# Patient Record
Sex: Male | Born: 1995 | Race: Black or African American | Hispanic: No | Marital: Single | Smoking: Never smoker
Health system: Southern US, Community
[De-identification: ages and names within clinical notes are randomized; demographics above are authoritative.]

## PROBLEM LIST (undated history)

## (undated) ENCOUNTER — Ambulatory Visit (HOSPITAL_COMMUNITY): Disposition: A | Discharge status: Home / Self Care | Payer: BLUE CROSS/BLUE SHIELD

---
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
## 2013-01-11 ENCOUNTER — Other Ambulatory Visit (HOSPITAL_COMMUNITY)
Admission: RE | Admit: 2013-01-11 | Discharge: 2013-01-11 | Disposition: A | Discharge status: Home / Self Care | Payer: Medicaid Other | Source: Ambulatory Visit | Attending: Family Medicine | Admitting: Family Medicine

## 2013-01-11 ENCOUNTER — Encounter (HOSPITAL_COMMUNITY): Payer: Self-pay

## 2013-01-11 ENCOUNTER — Emergency Department (INDEPENDENT_AMBULATORY_CARE_PROVIDER_SITE_OTHER)
Admission: EM | Admit: 2013-01-11 | Discharge: 2013-01-11 | Disposition: A | Discharge status: Home / Self Care | Payer: Medicaid Other | Source: Home / Self Care | Attending: Family Medicine | Admitting: Family Medicine

## 2013-01-11 DIAGNOSIS — Z113 Encounter for screening for infections with a predominantly sexual mode of transmission: Secondary | ICD-10-CM | POA: Insufficient documentation

## 2013-01-11 DIAGNOSIS — Z9189 Other specified personal risk factors, not elsewhere classified: Secondary | ICD-10-CM

## 2013-01-11 DIAGNOSIS — Z202 Contact with and (suspected) exposure to infections with a predominantly sexual mode of transmission: Secondary | ICD-10-CM

## 2013-01-11 NOTE — ED Notes (Signed)
Call back number for lab reports verified at release

## 2013-01-11 NOTE — ED Provider Notes (Signed)
  CSN: [id number]     Arrival date & time 01/11/13  1301 History     First MD Initiated Contact with Patient 01/11/13 1432     Chief Complaint  Patient presents with  . Exposure to STD   (Consider location/radiation/quality/duration/timing/severity/associated sxs/prior Treatment) HPI Comments: Male partner has "bumps" she thought was herpes, but she was tested for herpes and doesn't have it. Pt wants STD check today. Denies having any sx.   Patient is a 17 y.o. male presenting with STD exposure. The history is provided by the patient.  Exposure to STD This is a new problem. The problem has not changed since onset.Pertinent negatives include no abdominal pain. Nothing aggravates the symptoms. Nothing relieves the symptoms. He has tried nothing for the symptoms.    History reviewed. No pertinent past medical history. History reviewed. No pertinent past surgical history. History reviewed. No pertinent family history. History  Substance Use Topics  . Smoking status: Never Smoker   . Smokeless tobacco: Not on file  . Alcohol Use: No    Review of Systems  Constitutional: Negative for fever and chills.  Gastrointestinal: Negative for abdominal pain.  Genitourinary: Negative for dysuria, flank pain, discharge, genital sores, penile pain and testicular pain.  Skin: Negative for rash.    Allergies  Review of patient's allergies indicates no known allergies.  Home Medications   Current Outpatient Rx  Name  Route  Sig  Dispense  Refill  . cyclobenzaprine (FLEXERIL) 10 MG tablet   Oral   Take 1 tablet (10 mg total) by mouth 3 (three) times daily as needed for muscle spasms.   30 tablet   0   . meloxicam (MOBIC) 15 MG tablet   Oral   Take 1 tablet (15 mg total) by mouth daily as needed for pain.   30 tablet   0    BP 125/65  Pulse 66  Temp(Src) 98 F (36.7 C) (Oral)  Resp 16  SpO2 100% Physical Exam  Constitutional: He appears well-developed and well-nourished. No  distress.  Abdominal: Hernia confirmed negative in the right inguinal area and confirmed negative in the left inguinal area.  Genitourinary: Testes normal and penis normal. Circumcised. No penile erythema or penile tenderness. No discharge found.  Lymphadenopathy:       Right: No inguinal adenopathy present.       Left: No inguinal adenopathy present.    ED Course   Procedures (including critical care time)  Labs Reviewed  RPR  HIV ANTIBODY (ROUTINE TESTING)  URINE CYTOLOGY ANCILLARY ONLY   No results found. 1. Possible exposure to STD     MDM  Tested for HIV, syphilis, gc, chlamydia, and trich. Pt defers tx until test results back as is asymptomatic and does not have certain exposure.   Cathlyn Parsons, NP 01/11/13 1436

## 2013-01-11 NOTE — ED Notes (Signed)
Reported exposure to male that has a "rash ", thought it was herpes, but not testing positive ; patient denies any symptoms, rash, pain, burning, d/c

## 2013-01-12 LAB — HIV ANTIBODY (ROUTINE TESTING W REFLEX): HIV: NONREACTIVE

## 2013-01-12 LAB — RPR: RPR Ser Ql: NONREACTIVE

## 2013-01-12 NOTE — ED Provider Notes (Signed)
Medical screening examination/treatment/procedure(s) were performed by non-physician practitioner and as supervising physician I was immediately available for consultation/collaboration.   MORENO-COLL,Basya Casavant; MD  Landon Bassford Moreno-Coll, MD 01/12/13 0820 

## 2013-11-28 IMAGING — CR DG HAND COMPLETE 3+V*R*
3 series · 3 of 3 positions shown · non-contrast
Comparison: None.

CLINICAL DATA: Jammed playing basketball

RIGHT HAND - COMPLETE 3+ VIEW

[x hand pa right]
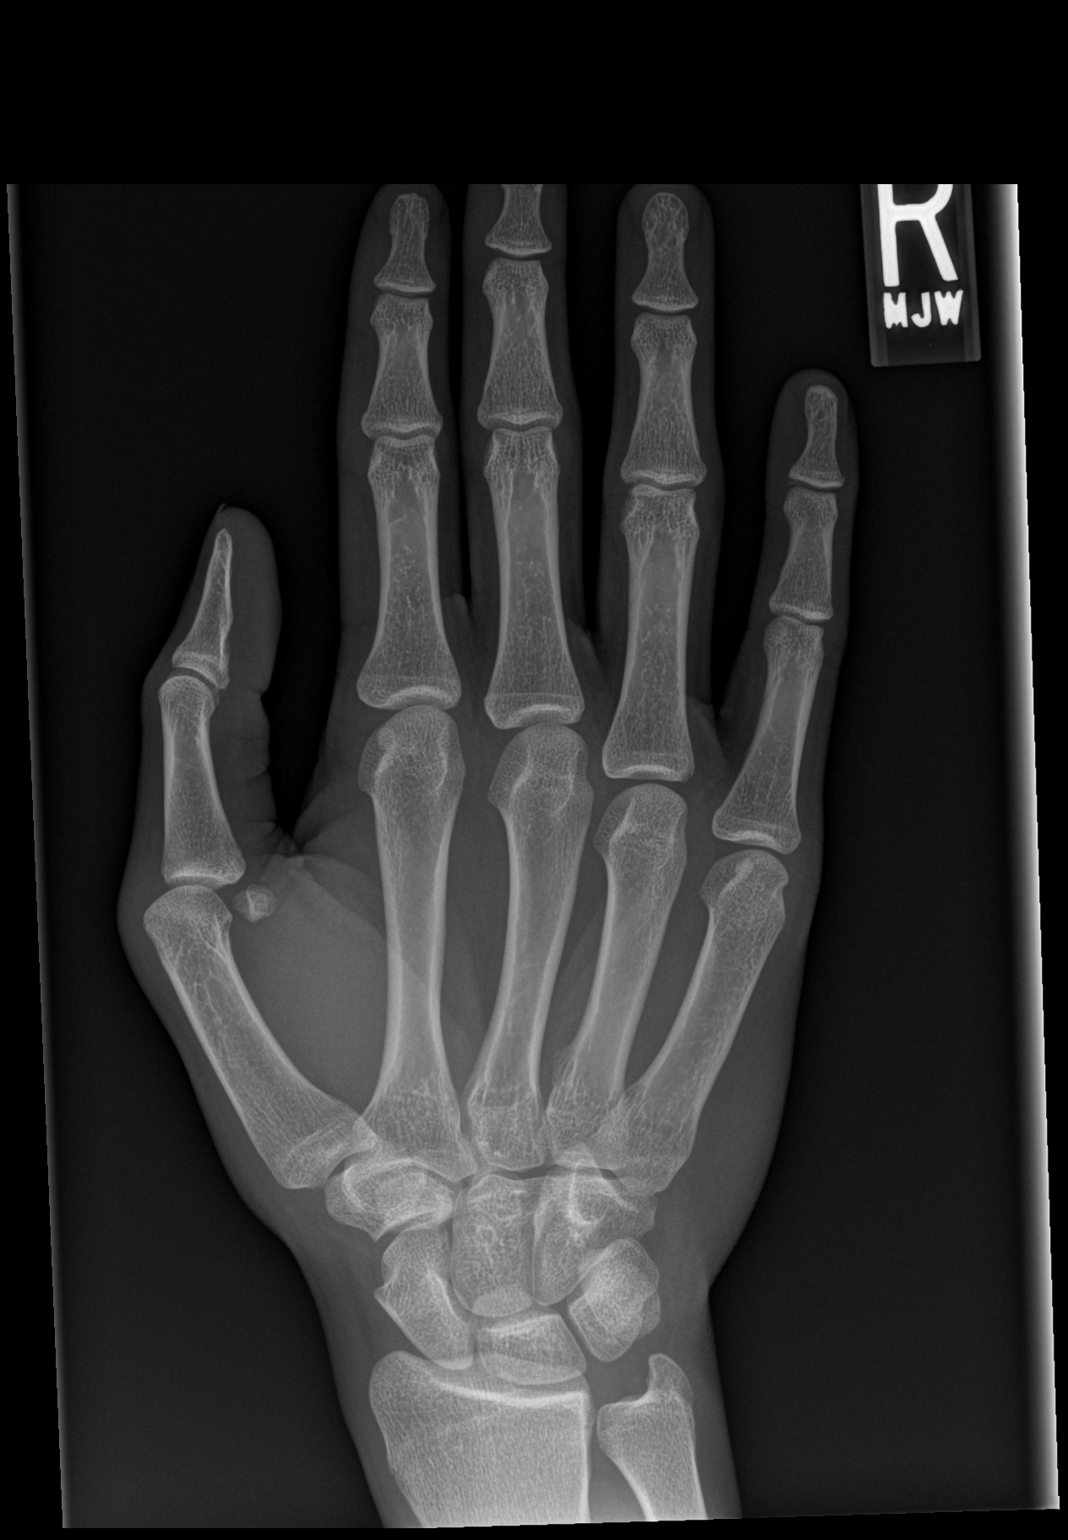

[x hand obl right]
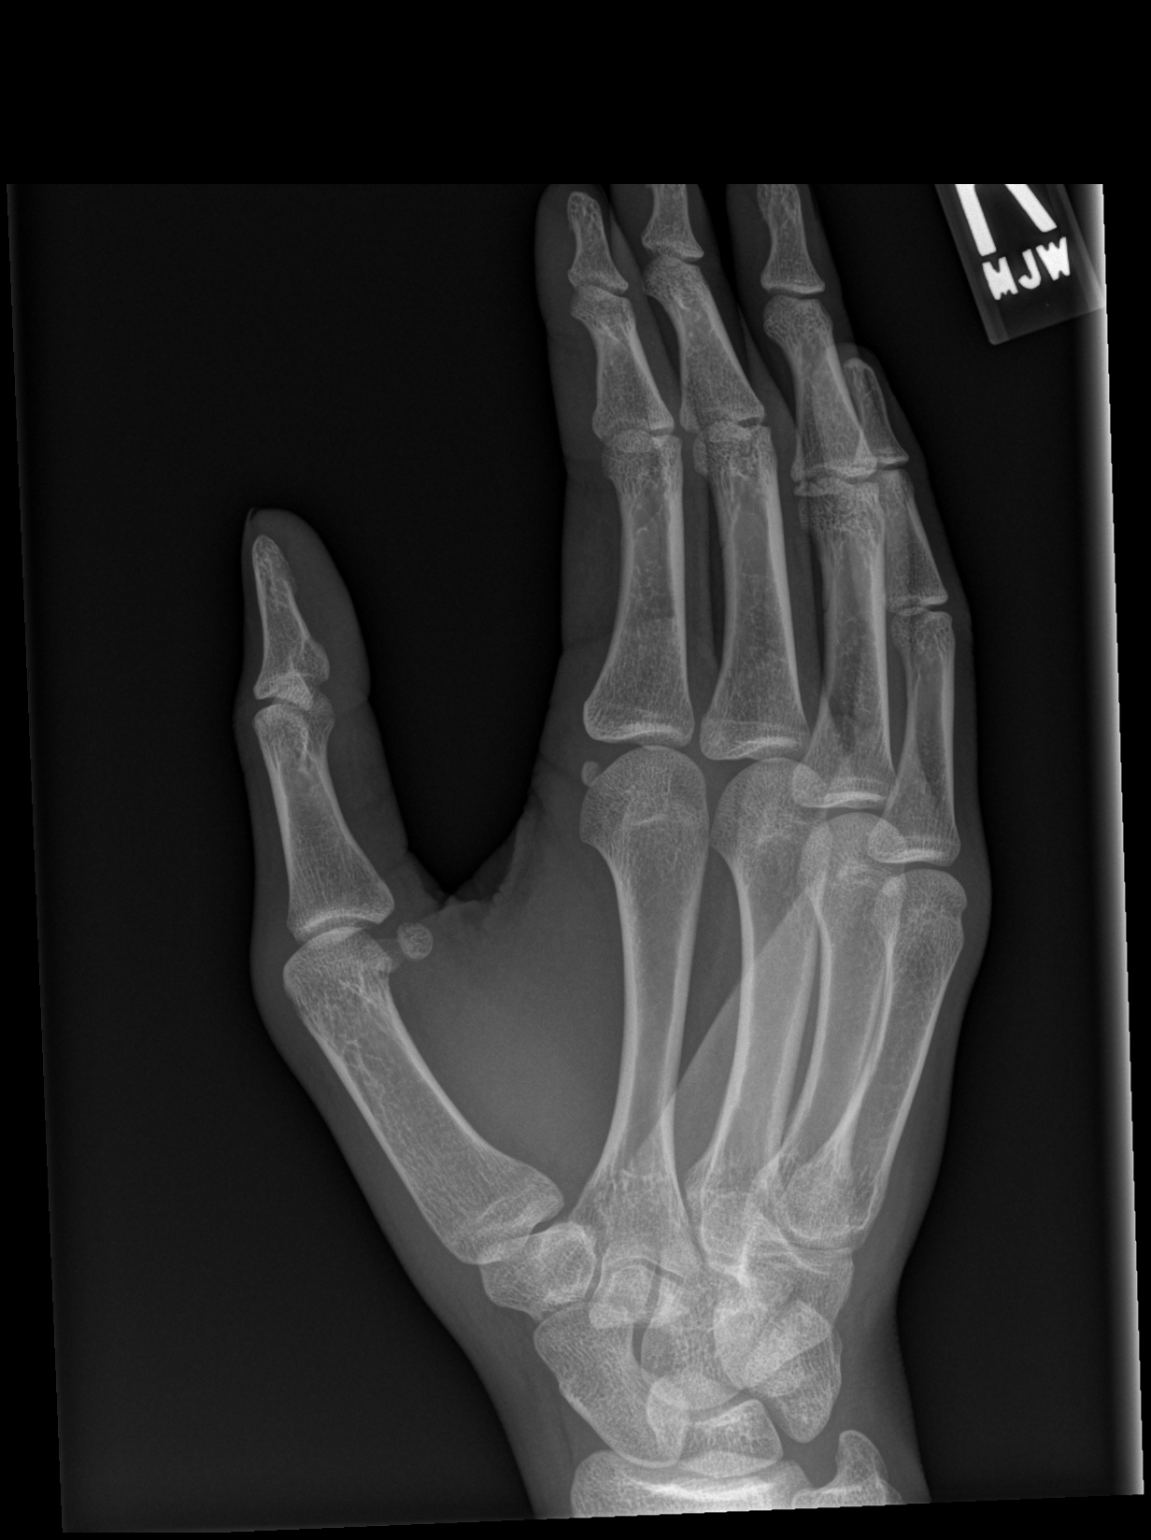

[x hand lat right]
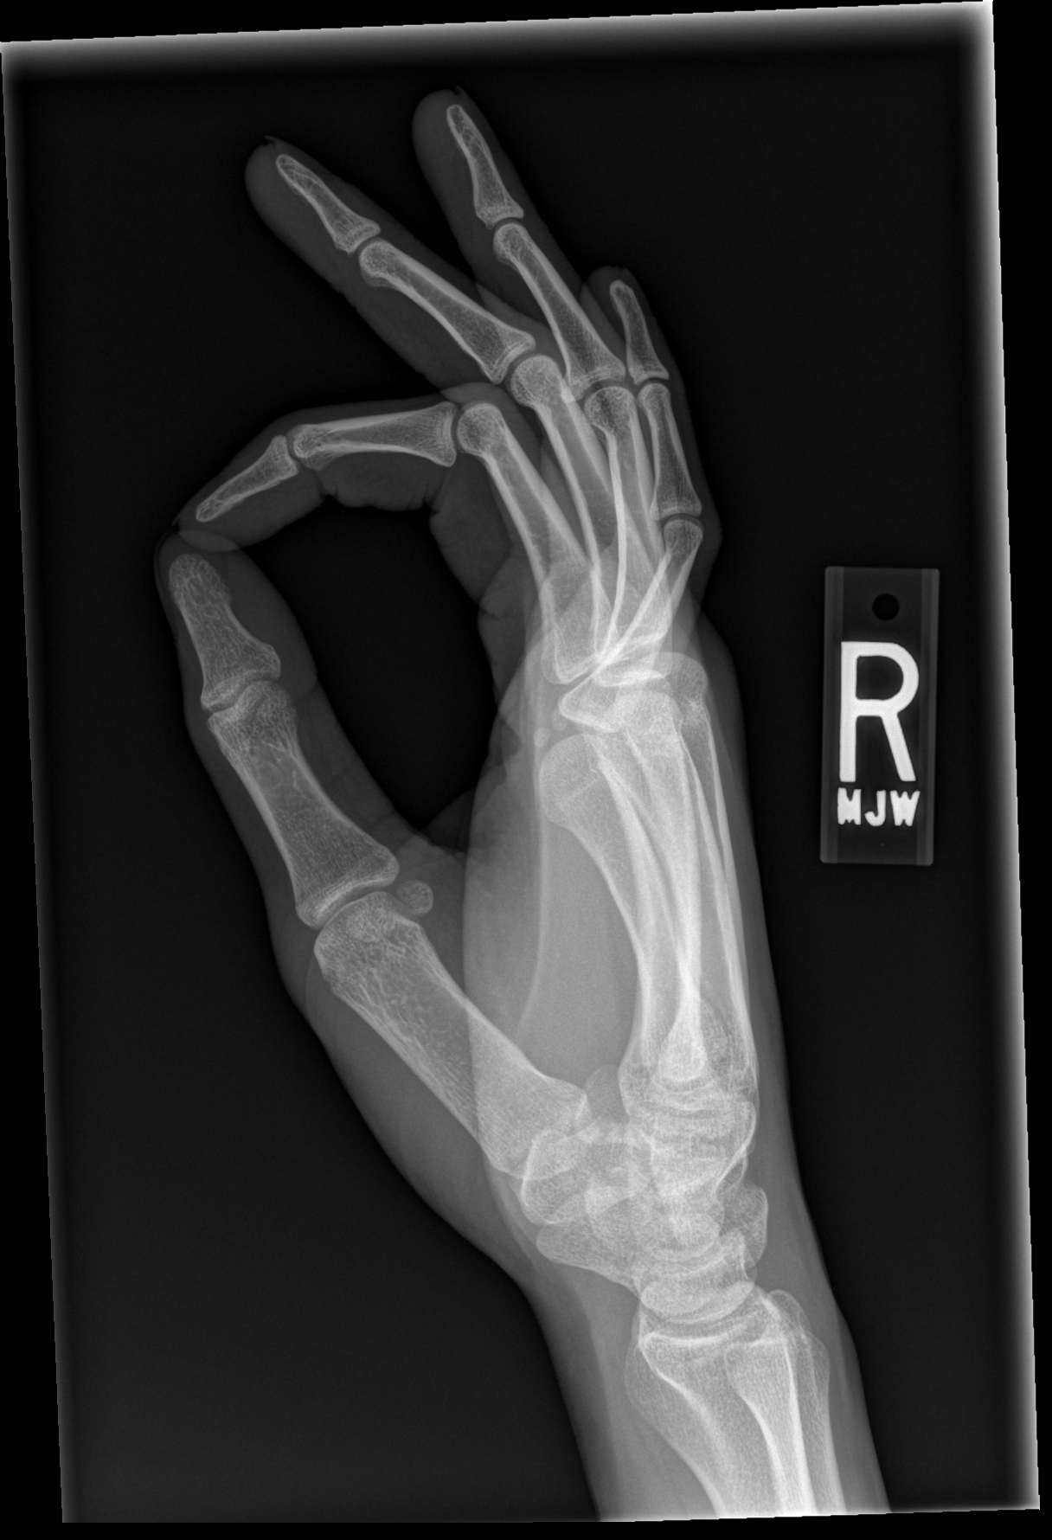

[3 of 3 positions shown; findings below may reference images not displayed]

FINDINGS: Carpal rows intact. Negative for fracture, dislocation,
or other acute abnormality.  Normal alignment and mineralization.
No significant degenerative change.  Regional soft tissues
unremarkable.
IMPRESSION: Negative

## 2015-01-17 ENCOUNTER — Other Ambulatory Visit (HOSPITAL_COMMUNITY)
Admission: RE | Admit: 2015-01-17 | Discharge: 2015-01-17 | Disposition: A | Discharge status: Home / Self Care | Payer: Self-pay | Source: Ambulatory Visit | Attending: Family Medicine | Admitting: Family Medicine

## 2015-01-17 ENCOUNTER — Emergency Department (INDEPENDENT_AMBULATORY_CARE_PROVIDER_SITE_OTHER)
Admission: EM | Admit: 2015-01-17 | Discharge: 2015-01-17 | Disposition: A | Discharge status: Home / Self Care | Payer: Self-pay | Source: Home / Self Care | Attending: Family Medicine | Admitting: Family Medicine

## 2015-01-17 ENCOUNTER — Encounter (HOSPITAL_COMMUNITY): Payer: Self-pay | Admitting: Emergency Medicine

## 2015-01-17 DIAGNOSIS — Z113 Encounter for screening for infections with a predominantly sexual mode of transmission: Secondary | ICD-10-CM | POA: Insufficient documentation

## 2015-01-17 DIAGNOSIS — N342 Other urethritis: Secondary | ICD-10-CM

## 2015-01-17 LAB — POCT URINALYSIS DIP (DEVICE)
BILIRUBIN URINE: NEGATIVE
GLUCOSE, UA: NEGATIVE mg/dL
HGB URINE DIPSTICK: NEGATIVE
Ketones, ur: NEGATIVE mg/dL
LEUKOCYTES UA: NEGATIVE
NITRITE: NEGATIVE
Protein, ur: NEGATIVE mg/dL
Specific Gravity, Urine: 1.015 (ref 1.005–1.030)
UROBILINOGEN UA: 0.2 mg/dL (ref 0.0–1.0)
pH: 7.5 (ref 5.0–8.0)

## 2015-01-17 MED ORDER — PHENAZOPYRIDINE HCL 100 MG PO TABS: 100.0000 mg | ORAL_TABLET | Freq: Three times a day (TID) | ORAL | Status: DC | PRN

## 2015-01-17 NOTE — ED Notes (Signed)
C/o std States he has burning when urinating Denies any blood and discharge

## 2015-01-17 NOTE — Discharge Instructions (Signed)
The cause of your pain is not immediately clear. This may be from a mild bacterial infection or an STD.  Please use the pyridium for pain We will call you if your results show further infection requiring treatment

## 2015-01-17 NOTE — ED Provider Notes (Signed)
CSN: [id number]     Arrival date & time 01/17/15  1526 History   First MD Initiated Contact with Patient 01/17/15 1610     Chief Complaint  Patient presents with  . Exposure to STD   (Consider location/radiation/quality/duration/timing/severity/associated sxs/prior Treatment) HPI  Penile pain. Present w/ urination only. Denies frequency, fevers, adenopathy, unexplained wt loss, back pain, rash, penile lesions. Sexually active w/ girls only. No oral sex. Sexually active but states that the condom broke 2 wks ago. Unaware of any STDs  his partners has. Symptoms are not getting better or worse. Symptoms are intermittent.    History reviewed. No pertinent past medical history. History reviewed. No pertinent past surgical history. History reviewed. No pertinent family history. Social History  Substance Use Topics  . Smoking status: Never Smoker   . Smokeless tobacco: None  . Alcohol Use: No    Review of Systems Per HPI with all other pertinent systems negative.   Allergies  Review of patient's allergies indicates no known allergies.  Home Medications   Prior to Admission medications   Medication Sig Start Date End Date Taking? Authorizing Provider  cyclobenzaprine (FLEXERIL) 10 MG tablet Take 1 tablet (10 mg total) by mouth 3 (three) times daily as needed for muscle spasms. 12/20/12   Rodolph Bong, MD  meloxicam (MOBIC) 15 MG tablet Take 1 tablet (15 mg total) by mouth daily as needed for pain. 12/20/12   Rodolph Bong, MD  phenazopyridine (PYRIDIUM) 100 MG tablet Take 1-2 tablets (100-200 mg total) by mouth 3 (three) times daily as needed for pain. 01/17/15   Ozella Rocks, MD   Meds Ordered and Administered this Visit  Medications - No data to display  BP 118/77 mmHg  Pulse 85  Temp(Src) 97.7 F (36.5 C) (Oral)  Resp 16  SpO2 94% No data found.   Physical Exam Physical Exam  Constitutional: oriented to person, place, and time. appears well-developed and well-nourished.  No distress.  HENT:  Head: Normocephalic and atraumatic.  Eyes: EOMI. PERRL.  Neck: Normal range of motion.  Cardiovascular: RRR, no m/r/g, 2+ distal pulses,  Pulmonary/Chest: Effort normal and breath sounds normal. No respiratory distress.  Abdominal: Soft. Bowel sounds are normal. NonTTP, no distension.  Musculoskeletal: Normal range of motion. Non ttp, no effusion.  Neurological: alert and oriented to person, place, and time.  Skin: Skin is warm. No rash noted. non diaphoretic.  Psychiatric: normal mood and affect. behavior is normal. Judgment and thought content normal.   GU: Circumcised, no penile lesions, no groin adenopathy, testicles normal.  ED Course  Procedures (including critical care time)  Labs Review Labs Reviewed  URINE CULTURE  POCT URINALYSIS DIP (DEVICE)  URINE CYTOLOGY ANCILLARY ONLY     No results found.   Visual Acuity Review  Right Eye Distance:   Left Eye Distance:   Bilateral Distance:    Right Eye Near:   Left Eye Near:    Bilateral Near:         MDM   1. Urethritis    STD panel sent. Start Pyridium. UA normal. Urine culture sent.    Ozella Rocks, MD 01/17/15 [phone]

## 2015-01-18 LAB — URINE CULTURE: Culture: NO GROWTH

## 2015-01-18 LAB — URINE CYTOLOGY ANCILLARY ONLY
Chlamydia: POSITIVE — AB
NEISSERIA GONORRHEA: NEGATIVE

## 2015-01-18 NOTE — ED Notes (Signed)
Final report of STD screening positive for chlamydia, untreated. discussed w JD Kindl, who authorized 1 x dose 1 GM azithromycin , stat , NR. Called patient and after verifying ID discussed report. Called Rx to [facility] at patient request, spoke w pharmacy staff. Form  2124 [employer]HS completed and faxed to [facility] for their records. Patient advised to inform partner, avoid sex x 1 week, and to practice safer sex

## 2015-07-04 ENCOUNTER — Emergency Department (INDEPENDENT_AMBULATORY_CARE_PROVIDER_SITE_OTHER)
Admission: EM | Admit: 2015-07-04 | Discharge: 2015-07-04 | Disposition: A | Discharge status: Home / Self Care | Payer: BLUE CROSS/BLUE SHIELD | Source: Home / Self Care | Attending: Family Medicine | Admitting: Family Medicine

## 2015-07-04 ENCOUNTER — Encounter (HOSPITAL_COMMUNITY): Payer: Self-pay | Admitting: *Deleted

## 2015-07-04 DIAGNOSIS — J111 Influenza due to unidentified influenza virus with other respiratory manifestations: Secondary | ICD-10-CM

## 2015-07-04 MED ORDER — OSELTAMIVIR PHOSPHATE 75 MG PO CAPS: 75.0000 mg | ORAL_CAPSULE | Freq: Two times a day (BID) | ORAL | Status: DC

## 2015-07-04 NOTE — ED Notes (Signed)
Pt  Reports  Symptoms  Of  Fever  Body  Aches       Chills     Symptoms  Started  Yesterday          pt  Is  Awake  And  Alert  And  Oriented

## 2015-07-04 NOTE — ED Provider Notes (Signed)
CSN: [id number]     Arrival date & time 07/04/15  1308 History   First MD Initiated Contact with Patient 07/04/15 1356     Chief Complaint  Patient presents with  . Fever   (Consider location/radiation/quality/duration/timing/severity/associated sxs/prior Treatment) HPI History obtained from patient:    Patient states he has been ill since yesterday. Nephew with the flu that he has been exposed to.  Tylenol for symptoms at home. Pain score 3 (body aches, headache) also with associated fever. Symptoms present constantly History reviewed. No pertinent past medical history. History reviewed. No pertinent past surgical history. History reviewed. No pertinent family history. Social History  Substance Use Topics  . Smoking status: Never Smoker   . Smokeless tobacco: None  . Alcohol Use: No    Review of Systems ROS +'vebody aches, fever  Denies: HEADACHE, NAUSEA, ABDOMINAL PAIN, CHEST PAIN, CONGESTION, DYSURIA, SHORTNESS OF BREATH   Allergies  Review of patient's allergies indicates no known allergies.  Home Medications   Prior to Admission medications   Medication Sig Start Date End Date Taking? Authorizing Provider  cyclobenzaprine (FLEXERIL) 10 MG tablet Take 1 tablet (10 mg total) by mouth 3 (three) times daily as needed for muscle spasms. 12/20/12   Rodolph Bong, MD  meloxicam (MOBIC) 15 MG tablet Take 1 tablet (15 mg total) by mouth daily as needed for pain. 12/20/12   Rodolph Bong, MD  oseltamivir (TAMIFLU) 75 MG capsule Take 1 capsule (75 mg total) by mouth every 12 (twelve) hours. 07/04/15   Tharon Aquas, PA  phenazopyridine (PYRIDIUM) 100 MG tablet Take 1-2 tablets (100-200 mg total) by mouth 3 (three) times daily as needed for pain. 01/17/15   Ozella Rocks, MD   Meds Ordered and Administered this Visit  Medications - No data to display  BP 124/80 mmHg  Pulse 88  Temp(Src) 102.4 F (39.1 C) (Oral)  Resp 18  SpO2 99% No data found.   Physical Exam   Constitutional: He is oriented to person, place, and time. He appears well-developed.  HENT:  Head: Normocephalic and atraumatic.  Eyes: Conjunctivae are normal.  Neck: Normal range of motion. Neck supple.  Cardiovascular: Normal rate.   Pulmonary/Chest: Effort normal and breath sounds normal.  Neurological: He is alert and oriented to person, place, and time.  Skin: Skin is warm and dry.  Psychiatric: He has a normal mood and affect. His behavior is normal.  Nursing note and vitals reviewed.   ED Course  Procedures (including critical care time)  Labs Review Labs Reviewed - No data to display  Imaging Review No results found.   Visual Acuity Review  Right Eye Distance:   Left Eye Distance:   Bilateral Distance:    Right Eye Near:   Left Eye Near:    Bilateral Near:         MDM   1. Flu syndrome    Pt would like to have Rx for tamiflu.  Note for work provided.     Tharon Aquas, PA 07/04/15 1850

## 2015-07-04 NOTE — Discharge Instructions (Signed)

## 2015-09-26 ENCOUNTER — Encounter (HOSPITAL_COMMUNITY): Payer: Self-pay | Admitting: Emergency Medicine

## 2015-09-26 ENCOUNTER — Ambulatory Visit (HOSPITAL_COMMUNITY)
Admission: EM | Admit: 2015-09-26 | Discharge: 2015-09-26 | Disposition: A | Discharge status: Home / Self Care | Payer: BLUE CROSS/BLUE SHIELD | Attending: Family Medicine | Admitting: Family Medicine

## 2015-09-26 DIAGNOSIS — Z8619 Personal history of other infectious and parasitic diseases: Secondary | ICD-10-CM | POA: Insufficient documentation

## 2015-09-26 DIAGNOSIS — R300 Dysuria: Secondary | ICD-10-CM | POA: Diagnosis present

## 2015-09-26 LAB — POCT URINALYSIS DIP (DEVICE)
Glucose, UA: NEGATIVE mg/dL
Hgb urine dipstick: NEGATIVE
Nitrite: NEGATIVE
Protein, ur: 30 mg/dL — AB
Specific Gravity, Urine: 1.015 (ref 1.005–1.030)
Urobilinogen, UA: 4 mg/dL — ABNORMAL HIGH (ref 0.0–1.0)
pH: 8.5 — ABNORMAL HIGH (ref 5.0–8.0)

## 2015-09-26 MED ORDER — AZITHROMYCIN 250 MG PO TABS
1000.0000 mg | ORAL_TABLET | Freq: Once | ORAL | Status: AC
  Administered 2015-09-26: 1000 mg via ORAL

## 2015-09-26 MED ORDER — CEFTRIAXONE SODIUM 250 MG IJ SOLR
250.0000 mg | Freq: Once | INTRAMUSCULAR | Status: AC
  Administered 2015-09-26: 250 mg via INTRAMUSCULAR

## 2015-09-26 MED ORDER — LIDOCAINE HCL (PF) 1 % IJ SOLN
INTRAMUSCULAR | Status: AC
  Filled 2015-09-26: qty 5

## 2015-09-26 MED ORDER — AZITHROMYCIN 250 MG PO TABS
ORAL_TABLET | ORAL | Status: AC
  Filled 2015-09-26: qty 4

## 2015-09-26 MED ORDER — CEFTRIAXONE SODIUM 250 MG IJ SOLR
INTRAMUSCULAR | Status: AC
  Filled 2015-09-26: qty 250

## 2015-09-26 NOTE — ED Notes (Signed)
Call back number verified.  

## 2015-09-26 NOTE — ED Provider Notes (Signed)
CSN: [id number]     Arrival date & time 09/26/15  1414 History   First MD Initiated Contact with Patient 09/26/15 1439     No chief complaint on file.  (Consider location/radiation/quality/duration/timing/severity/associated sxs/prior Treatment) The history is provided by the patient. No language interpreter was used.  Patient presents with complaint of penile discomfort both with and without voiding for past few days, no penile discharge.  Has been with same male sexual partner for past six months. Prior history CT urethritis in August 2016, treated.  Patient reports that he was [facility] in [facility] on [address] for tingling sensation in penis, had STD testing and told was negative, sxs went away until the past few days.   Denies penile discharge, no fevers/chills, no abd pain, no nausea/vomiting/diarrhea. Male sexual partner has denied any rashes or skin lesions, no vaginal discharge or dysuria.   Patient denies any medical allergies.   No past medical history on file. No past surgical history on file. No family history on file. Social History  Substance Use Topics  . Smoking status: Never Smoker   . Smokeless tobacco: Not on file  . Alcohol Use: No    Review of Systems  Constitutional: Negative for fever, chills, diaphoresis, activity change, appetite change and fatigue.  Cardiovascular: Negative for chest pain.  Gastrointestinal: Negative for abdominal pain, blood in stool, abdominal distention and anal bleeding.  Genitourinary: Positive for dysuria and penile pain. Negative for frequency, hematuria, flank pain, discharge, penile swelling, scrotal swelling, difficulty urinating, genital sores and testicular pain.    Allergies  Review of patient's allergies indicates no known allergies.  Home Medications   Prior to Admission medications   Medication Sig Start Date End Date Taking? Authorizing Provider  cyclobenzaprine (FLEXERIL) 10 MG tablet  Take 1 tablet (10 mg total) by mouth 3 (three) times daily as needed for muscle spasms. 12/20/12   Rodolph BongEvan S Corey, MD  meloxicam (MOBIC) 15 MG tablet Take 1 tablet (15 mg total) by mouth daily as needed for pain. 12/20/12   Rodolph BongEvan S Corey, MD  oseltamivir (TAMIFLU) 75 MG capsule Take 1 capsule (75 mg total) by mouth every 12 (twelve) hours. 07/04/15   Tharon AquasFrank C Patrick, PA  phenazopyridine (PYRIDIUM) 100 MG tablet Take 1-2 tablets (100-200 mg total) by mouth 3 (three) times daily as needed for pain. 01/17/15   Ozella Rocksavid J Merrell, MD   Meds Ordered and Administered this Visit  Medications - No data to display  BP 112/80 mmHg  Pulse 99  Temp(Src) 97.5 F (36.4 C) (Oral)  Resp 18  SpO2 100% No data found.   Physical Exam  Constitutional: He appears well-developed and well-nourished. No distress.  Neck: Normal range of motion. Neck supple.  Abdominal: Soft. There is no tenderness.  Genitourinary: Penis normal. No penile tenderness.  Shotty inguinal adenopathy bilaterally.   No penile lesions. No urethral meatus erythema or discharge noted on exam.   Lymphadenopathy:    He has no cervical adenopathy.  Skin: He is not diaphoretic.    ED Course  Procedures (including critical care time)  Labs Review Labs Reviewed  URINE CYTOLOGY ANCILLARY ONLY    Imaging Review No results found.   Visual Acuity Review  Right Eye Distance:   Left Eye Distance:   Bilateral Distance:    Right Eye Near:   Left Eye Near:    Bilateral Near:         MDM   1. Dysuria  2. History of chlamydia infection    Patient with prior history of Chlamydia urethritis, treated in Aug 2016.  Now with similar symptoms.  For dirty and clean-catch urine collection for UA and GC/CT NAAT.  Empiric treatment for urethritis.  Encouraged to get primary care physician for longitudinal care. No sexual intercourse until results of today's testing are back.  HIV and RPR testing offered to patient, he accepts offer for testing.      Barbaraann Barthel, MD 09/26/15 1455

## 2015-09-26 NOTE — ED Notes (Signed)
Here for STD testing... Reports penile d/c/tingling.... Denies penile d/c A&O x4... No acute distress.

## 2015-09-26 NOTE — Discharge Instructions (Signed)
It is a pleasure to see you today for the penile discomfort.   As we discussed, we will treat you empirically for possible urethral infections, with AZITHROMYCIN 1gram by mouth and CEFTRIAXONE 250mg  intramuscular injection, in the urgent care center today.   You will be contacted with the results of the testing, which includes testing for chlamydia and gonorrhea, as well as HIV and syphilis.    It is recommended that you not engage in sexual activity until after the results of the testing have all come back and your symptoms have resolved.

## 2015-09-27 LAB — RPR: RPR: NONREACTIVE

## 2015-09-27 LAB — HIV ANTIBODY (ROUTINE TESTING W REFLEX): HIV Screen 4th Generation wRfx: NONREACTIVE

## 2015-09-28 LAB — URINE CYTOLOGY ANCILLARY ONLY
CHLAMYDIA, DNA PROBE: POSITIVE — AB
NEISSERIA GONORRHEA: NEGATIVE
TRICH (WINDOWPATH): NEGATIVE

## 2015-10-02 ENCOUNTER — Telehealth (HOSPITAL_COMMUNITY): Payer: Self-pay | Admitting: Emergency Medicine

## 2015-10-02 NOTE — ED Notes (Signed)
LM on pt's VM [phone] Need to give lab results from recent visit on 5/4 Also let pt know labs can be obtained from MyChart  Per Dr. Dayton ScrapeMurray,  Clinical staff, please let patient and health department know that test for chlamydia was positive.  Tests for gonorrhea and trichomonas were negative. This (chlamydia) was treated at the urgent care visit 09/26/15 with dose of zithromax 1g by mouth.  Patient should notify sexual partners of the need to be tested/treated.  Result note copied to patient's MyChart. Ria ClockLaura Murray MD   [facility]

## 2015-10-08 NOTE — ED Notes (Signed)
Called pt and notified of recent lab results from visit 5/4 Pt ID'd properly... Reports feeling better and sx have subsided  Per Dr. Dayton ScrapeMurray,  Clinical staff, please let patient and health department know that test for chlamydia was positive.  Tests for gonorrhea and trichomonas were negative. This (chlamydia) was treated at the urgent care visit 09/26/15 with dose of zithromax 1g by mouth.  Patient should notify sexual partners of the need to be tested/treated.  Result note copied to patient's MyChart. Ria ClockLaura Murray MD  Adv pt if sx are not getting better to return  Pt verb understanding Education on safe sex given [facility] has [facility].

## 2015-11-28 ENCOUNTER — Ambulatory Visit (HOSPITAL_COMMUNITY)
Admission: EM | Admit: 2015-11-28 | Discharge: 2015-11-28 | Disposition: A | Discharge status: Home / Self Care | Payer: BLUE CROSS/BLUE SHIELD | Attending: Emergency Medicine | Admitting: Emergency Medicine

## 2015-11-28 DIAGNOSIS — Z113 Encounter for screening for infections with a predominantly sexual mode of transmission: Secondary | ICD-10-CM

## 2015-11-28 DIAGNOSIS — Z79899 Other long term (current) drug therapy: Secondary | ICD-10-CM | POA: Insufficient documentation

## 2015-11-28 NOTE — Discharge Instructions (Signed)
Routine testing and screening for STD should be done in the health department or with your primary care doctor. Please call the telephone numbers listed in the instruction sheets to obtain a primary care doctor.

## 2015-11-28 NOTE — ED Provider Notes (Signed)
CSN: [id number]     Arrival date & time 11/28/15  1317 History   First MD Initiated Contact with Patient 11/28/15 1626     No chief complaint on file.  (Consider location/radiation/quality/duration/timing/severity/associated sxs/prior Treatment) HPI Comments: 20 year old male is here for STD screening. He denies having symptoms he denies any known recent exposure.   No past medical history on file. No past surgical history on file. No family history on file. Social History  Substance Use Topics  . Smoking status: Never Smoker   . Smokeless tobacco: Not on file  . Alcohol Use: No    Review of Systems  All other systems reviewed and are negative.   Allergies  Review of patient's allergies indicates no known allergies.  Home Medications   Prior to Admission medications   Medication Sig Start Date End Date Taking? Authorizing Provider  cyclobenzaprine (FLEXERIL) 10 MG tablet Take 1 tablet (10 mg total) by mouth 3 (three) times daily as needed for muscle spasms. 12/20/12   Rodolph BongEvan S Corey, MD  meloxicam (MOBIC) 15 MG tablet Take 1 tablet (15 mg total) by mouth daily as needed for pain. 12/20/12   Rodolph BongEvan S Corey, MD  oseltamivir (TAMIFLU) 75 MG capsule Take 1 capsule (75 mg total) by mouth every 12 (twelve) hours. 07/04/15   Tharon AquasFrank C Patrick, PA  phenazopyridine (PYRIDIUM) 100 MG tablet Take 1-2 tablets (100-200 mg total) by mouth 3 (three) times daily as needed for pain. 01/17/15   Ozella Rocksavid J Merrell, MD   Meds Ordered and Administered this Visit  Medications - No data to display  BP 109/68 mmHg  Pulse 65  Temp(Src) 98.3 F (36.8 C) (Oral)  Resp 16  SpO2 100% No data found.   Physical Exam  Constitutional: He is oriented to person, place, and time. He appears well-developed and well-nourished. No distress.  Neck: Normal range of motion.  Cardiovascular: Normal rate.   Pulmonary/Chest: Effort normal.  Neurological: He is alert and oriented to person, place, and time.  Skin: Skin is  warm and dry.  Nursing note and vitals reviewed.   ED Course  Procedures (including critical care time)  Labs Review Labs Reviewed  URINE CYTOLOGY ANCILLARY ONLY    Imaging Review No results found.   Visual Acuity Review  Right Eye Distance:   Left Eye Distance:   Bilateral Distance:    Right Eye Near:   Left Eye Near:    Bilateral Near:         MDM   1. Screening for STD (sexually transmitted disease)    Urine cytology pending. Patient is asymptomatic. Treatment pending results. Routine testing and screening for STD should be done in the health department or with your primary care doctor. Please call the telephone numbers listed in the instruction sheets to obtain a primary care doctor.     Hayden Rasmussenavid Anglia Blakley, NP 11/28/15 [phone]

## 2015-11-29 LAB — URINE CYTOLOGY ANCILLARY ONLY
Chlamydia: POSITIVE — AB
NEISSERIA GONORRHEA: NEGATIVE
TRICH (WINDOWPATH): NEGATIVE

## 2015-12-01 ENCOUNTER — Telehealth (HOSPITAL_COMMUNITY): Payer: Self-pay | Admitting: Internal Medicine

## 2015-12-01 MED ORDER — AZITHROMYCIN 500 MG PO TABS: 1000.0000 mg | ORAL_TABLET | Freq: Once | ORAL | Status: DC

## 2015-12-01 NOTE — ED Notes (Signed)
Clinical staff, please let patient/health department know that test for chlamydia was positive. Will send rx for zithromax to pharmacy of record, [facility] on [address]. Sexual partners will need to be notified and tested/treated.   Tests for gonorrhea and trichomonas were negative.   Recheck as needed.  LM  Eustace MooreLaura W Twilla Khouri, MD 12/01/15 [phone]

## 2015-12-03 ENCOUNTER — Telehealth (HOSPITAL_COMMUNITY): Payer: Self-pay | Admitting: Emergency Medicine

## 2015-12-03 NOTE — Telephone Encounter (Signed)
Called pt and notified of recent lab results from visit 7/6 Pt ID'd properly... Reports feeling better and sx have subsided Adv pt if sx are not getting better to return  Report he will notify partner.  Adv him to p/u Rx at [facility] ([address]) Pt verb understanding Education on safe sex given [facility]  Per Dr. Dayton ScrapeMurray,   Notes Recorded by Eustace MooreLaura W Murray, MD on 12/01/2015 at 8:30 AM Clinical staff, please let patient/health department know that test for chlamydia was positive. Will send rx for zithromax to pharmacy of record, [facility] on [address]. Sexual partners will need to be notified and tested/treated.  Tests for gonorrhea and trichomonas were negative.  Recheck as needed. Result note copied to patient's MyChart. LM

## 2019-11-16 DIAGNOSIS — N469 Male infertility, unspecified: Secondary | ICD-10-CM | POA: Insufficient documentation

## 2021-08-04 ENCOUNTER — Other Ambulatory Visit: Payer: Self-pay

## 2021-08-04 ENCOUNTER — Emergency Department (HOSPITAL_COMMUNITY)
Admission: EM | Admit: 2021-08-04 | Discharge: 2021-08-04 | Disposition: A | Discharge status: Home / Self Care | Payer: Self-pay | Attending: Emergency Medicine | Admitting: Emergency Medicine

## 2021-08-04 ENCOUNTER — Emergency Department (HOSPITAL_COMMUNITY): Payer: Self-pay

## 2021-08-04 ENCOUNTER — Encounter (HOSPITAL_COMMUNITY): Payer: Self-pay

## 2021-08-04 DIAGNOSIS — R109 Unspecified abdominal pain: Secondary | ICD-10-CM | POA: Insufficient documentation

## 2021-08-04 DIAGNOSIS — R1030 Lower abdominal pain, unspecified: Secondary | ICD-10-CM

## 2021-08-04 NOTE — ED Notes (Signed)
Called ultrasound  ?On their way for doppler ultrasound  ?

## 2021-08-04 NOTE — ED Provider Notes (Signed)
?[facility] ?Provider Note ? ? ?CSN: [id number] ?Arrival date & time: 08/04/21  1927 ? ?  ? ?History ? ?Chief Complaint  ?Patient presents with  ? Groin Pain  ? ? ?Justin Jacobson is a 26 y.o. male. ? ?26 y.o male with no PMH presents to the ED with a chief complaint of bilateral groin tenderness x 5 days. Patient reports the feeling of numbness/tingling that began after sexual encounter. He has been tested for gonorrhea, chlamydia, trich. He was also tested for HIV, syphilis but has not had results. He also reports some discomfort along the bilateral testicle. No fever, no vomiting, penile discharge.  ? ?The history is provided by the patient and medical records.  ?Groin Pain ?This is a new problem. The current episode started more than 2 days ago.  ? ?  ? ?Home Medications ?Prior to Admission medications   ?Medication Sig Start Date End Date Taking? Authorizing Provider  ?azithromycin (ZITHROMAX) 500 MG tablet Take 2 tablets (1,000 mg total) by mouth once. Take first 2 tablets together, then 1 every day until finished. 12/01/15   Wynona Luna, MD  ?cyclobenzaprine (FLEXERIL) 10 MG tablet Take 1 tablet (10 mg total) by mouth 3 (three) times daily as needed for muscle spasms. 12/20/12   Gregor Hams, MD  ?meloxicam (MOBIC) 15 MG tablet Take 1 tablet (15 mg total) by mouth daily as needed for pain. 12/20/12   Gregor Hams, MD  ?oseltamivir (TAMIFLU) 75 MG capsule Take 1 capsule (75 mg total) by mouth every 12 (twelve) hours. 07/04/15   Konrad Felix, PA  ?phenazopyridine (PYRIDIUM) 100 MG tablet Take 1-2 tablets (100-200 mg total) by mouth 3 (three) times daily as needed for pain. 01/17/15   Waldemar Dickens, MD  ?   ? ?Allergies    ?Patient has no known allergies.   ? ?Review of Systems   ?Review of Systems  ?Constitutional:  Negative for fever.  ? ?Physical Exam ?Updated Vital Signs ?BP (!) 118/97 (BP Location: Right Arm)   Pulse 84   Temp 97.6 ?F (36.4 ?C) (Oral)   Resp  16   Ht 6' (1.829 m)   Wt 104.3 kg   SpO2 98%   BMI 31.19 kg/m?  ?Physical Exam ?Vitals and nursing note reviewed. Exam conducted with a chaperone present.  ?Constitutional:   ?   Appearance: He is well-developed.  ?HENT:  ?   Head: Normocephalic and atraumatic.  ?Eyes:  ?   General: No scleral icterus. ?   Pupils: Pupils are equal, round, and reactive to light.  ?Cardiovascular:  ?   Heart sounds: Normal heart sounds.  ?Pulmonary:  ?   Effort: Pulmonary effort is normal.  ?   Breath sounds: Normal breath sounds. No wheezing.  ?Chest:  ?   Chest wall: No tenderness.  ?Abdominal:  ?   General: Bowel sounds are normal. There is no distension.  ?   Palpations: Abdomen is soft.  ?   Tenderness: There is no abdominal tenderness.  ?Genitourinary: ?   Comments: Chaperoned by felicia RN. ?No penile discharge, no vesicles, no sores noted, minimal pain with palpation of BL testicle no pain along epididymitis.  ?Musculoskeletal:     ?   General: No tenderness or deformity.  ?   Cervical back: Normal range of motion.  ?Skin: ?   General: Skin is warm and dry.  ?Neurological:  ?   Mental Status: He is alert and oriented to  person, place, and time.  ? ? ?ED Results / Procedures / Treatments   ?Labs ?(all labs ordered are listed, but only abnormal results are displayed) ?Labs Reviewed - No data to display ? ?EKG ?None ? ?Radiology ?US SCROTUM W/DOPPLER ? ?Result Date: 08/04/2021 ?CLINICAL DATA:  Bilateral testicle pain for 5 days. EXAM: SCROTAL ULTRASOUND DOPPLER ULTRASOUND OF THE TESTICLES TECHNIQUE: Complete ultrasound examination of the testicles, epididymis, and other scrotal structures was performed. Color and spectral Doppler ultrasound were also utilized to evaluate blood flow to the testicles. COMPARISON:  None. FINDINGS: Right testicle Measurements: 4.4 x 2.3 x 2.9 cm. No mass or microlithiasis visualized. Left testicle Measurements: 4.4 x 2.4 x 2.7 cm. No mass or microlithiasis visualized. Right epididymis:  Normal in  size and appearance. Left epididymis:  Normal in size and appearance. Hydrocele:  None visualized. Varicocele:  None visualized. Pulsed Doppler interrogation of both testes demonstrates normal low resistance arterial and venous waveforms bilaterally. There is a questionable right inguinal hernia, not well demonstrated on this exam. IMPRESSION: 1. No evidence of testicular torsion or other acute process. 2. Questionable right inguinal hernia. Electronically Signed   By: Brett Fairy M.D.   On: 08/04/2021 22:13   ? ?Procedures ?Procedures  ? ? ?Medications Ordered in ED ?Medications - No data to display ? ?ED Course/ Medical Decision Making/ A&P ?  ?                        ?Medical Decision Making ?Amount and/or Complexity of Data Reviewed ?Radiology: ordered. ? ?Patient here with groin pain, reports he was sexually active with a male, condom did ribs. ? ?Previously tested for gonorrhea, chlamydia, trichomonas which all came back negative.  HIV, syphilis test at outside facility are currently pending. ? ?GU evaluation chaperoned by [facility] RN, no visible vesicles, no lesions noted, no rashes, no palpable adenopathy noted.  There is a inguinal hernia noted.  He does doing good amount of heavy lifting for work. ? ?Ultrasound was obtained to rule out torsion versus epididymitis.  This is negative on today's visit.  We will not retest for sexually transmitted infections at this time.  I have asked patient to follow-up for his HIV and syphilis results.  He is aware of plan and treatment, patient stable for discharge. ? ? ?Portions of this note were generated with [employer]. Dictation errors may occur despite best attempts at proofreading.   ?Final Clinical Impression(s) / ED Diagnoses ?Final diagnoses:  ?Inguinal pain, unspecified laterality  ? ? ?Rx / DC Orders ?ED Discharge Orders   ? ? None  ? ?  ? ? ?  ?Janeece Fitting, PA-C ?08/04/21 2242 ? ?  ?Davonna Belling, MD ?08/04/21 2328 ? ?

## 2021-08-04 NOTE — ED Triage Notes (Signed)
Pt reports groin tenderness bilaterally x 5 days.  ?

## 2021-08-04 NOTE — Discharge Instructions (Addendum)
We discussed the results of your ultrasound on today's visit. ? ?You may take Tylenol or ibuprofen in order to help with your pain. ? ?Follow-up with your prior testing in order to obtain further results. ?

## 2022-06-29 ENCOUNTER — Ambulatory Visit: Payer: Self-pay | Admitting: Family Medicine

## 2022-07-09 ENCOUNTER — Ambulatory Visit: Payer: Self-pay | Admitting: Family Medicine

## 2022-07-28 ENCOUNTER — Ambulatory Visit: Payer: Self-pay | Admitting: Family Medicine

## 2022-08-03 ENCOUNTER — Ambulatory Visit: Payer: Self-pay | Admitting: Family Medicine

## 2022-08-03 ENCOUNTER — Encounter: Payer: Self-pay | Admitting: Family Medicine

## 2022-08-03 VITALS — BP 128/84 | HR 89 | Resp 20 | Ht 73.0 in | Wt 236.2 lb

## 2022-08-03 DIAGNOSIS — R519 Headache, unspecified: Secondary | ICD-10-CM

## 2022-08-03 DIAGNOSIS — Z1159 Encounter for screening for other viral diseases: Secondary | ICD-10-CM

## 2022-08-03 DIAGNOSIS — Z13220 Encounter for screening for lipoid disorders: Secondary | ICD-10-CM

## 2022-08-03 DIAGNOSIS — G8929 Other chronic pain: Secondary | ICD-10-CM

## 2022-08-03 DIAGNOSIS — Z0000 Encounter for general adult medical examination without abnormal findings: Secondary | ICD-10-CM

## 2022-08-03 DIAGNOSIS — R7302 Impaired glucose tolerance (oral): Secondary | ICD-10-CM

## 2022-08-03 DIAGNOSIS — Z7689 Persons encountering health services in other specified circumstances: Secondary | ICD-10-CM

## 2022-08-03 NOTE — Progress Notes (Addendum)
New Patient Office Visit  Subjective   Patient ID: AMARR PLINE, male    DOB: June 23, 1995  Age: 27 y.o. MRN: [id number]  Chief Complaint  Patient presents with   Establish Care    Groin pain chronic, headaches x 6 months Wants physical if possible   Annual Exam    HPI  Pt reports he's had headaches, on the right side. He denies this happening everyday. He takes tylenol for the pain that helps. He says they are sharp in nature. 8/10. He hasn't had eye exam in over a year. Has an appt for this in April.  Has hx of bilateral groin pain 6 months ago. No pain recently. Pt would like to have physical with labs, self pay pt.  Pt likes to play basketball with friends for exercise. Not following specific diet plan.  Review of Systems  Neurological:  Positive for headaches.  All other systems reviewed and are negative.     Objective:     BP 128/84   Pulse 89   Resp 20   Ht '6\' 1"'$  (1.854 m)   Wt 236 lb 3.2 oz (107.1 kg)   SpO2 98%   BMI 31.16 kg/m    Physical Exam Vitals and nursing note reviewed.  Constitutional:      Appearance: Normal appearance. He is normal weight.  HENT:     Head: Normocephalic and atraumatic.     Right Ear: Tympanic membrane, ear canal and external ear normal.     Left Ear: Tympanic membrane, ear canal and external ear normal.     Nose: Nose normal.     Mouth/Throat:     Mouth: Mucous membranes are moist.     Pharynx: Oropharynx is clear.  Eyes:     Conjunctiva/sclera: Conjunctivae normal.     Pupils: Pupils are equal, round, and reactive to light.  Cardiovascular:     Rate and Rhythm: Normal rate and regular rhythm.     Pulses: Normal pulses.     Heart sounds: Normal heart sounds.  Pulmonary:     Effort: Pulmonary effort is normal.     Breath sounds: Normal breath sounds.  Abdominal:     General: Abdomen is flat. Bowel sounds are normal.  Skin:    General: Skin is warm.     Capillary Refill: Capillary refill takes less than 2  seconds.  Neurological:     General: No focal deficit present.     Mental Status: He is oriented to person, place, and time. Mental status is at baseline.  Psychiatric:        Mood and Affect: Mood normal.        Behavior: Behavior normal.        Thought Content: Thought content normal.        Judgment: Judgment normal.     No results found for any visits on 08/03/22.    The ASCVD Risk score (Arnett DK, et al., 2019) failed to calculate for the following reasons:   The 2019 ASCVD risk score is only valid for ages 4 to 81    Assessment & Plan:   Problem List Items Addressed This Visit   None Visit Diagnoses     Annual physical exam    -  Primary   Encounter to establish care with new doctor       Encounter for lipid screening for cardiovascular disease       Relevant Orders   Lipid panel   Need for hepatitis  C screening test       Relevant Orders   Hepatitis C antibody   Screening for viral disease       Relevant Orders   HIV Antibody (routine testing w rflx)   Impaired glucose tolerance       Relevant Orders   CBC with Differential/Platelet   Comprehensive metabolic panel   Hemoglobin A1c   Chronic right-sided headaches       Relevant Orders   Prolactin      Screening labs today Headaches could be from number of reasons, he does have appt for eye exam  next month. Will follow up on this. Also continue tylenol prn for headaches. Getting Prolactin levels today with labs. Also advised pt to monitor blood pressure outside the office to make sure HTN isn't causing headaches. Will follow up sooner pending lab results. Return in about 1 year (around 08/03/2023) for Annual Physical.    Leeanne Rio, MD

## 2022-08-04 LAB — COMPREHENSIVE METABOLIC PANEL
ALT: 17 IU/L (ref 0–44)
AST: 19 IU/L (ref 0–40)
Albumin/Globulin Ratio: 1.7 (ref 1.2–2.2)
Albumin: 4.7 g/dL (ref 4.3–5.2)
Alkaline Phosphatase: 39 IU/L — ABNORMAL LOW (ref 44–121)
BUN/Creatinine Ratio: 14 (ref 9–20)
BUN: 15 mg/dL (ref 6–20)
Bilirubin Total: 0.4 mg/dL (ref 0.0–1.2)
CO2: 24 mmol/L (ref 20–29)
Calcium: 9.5 mg/dL (ref 8.7–10.2)
Chloride: 103 mmol/L (ref 96–106)
Creatinine, Ser: 1.09 mg/dL (ref 0.76–1.27)
Globulin, Total: 2.7 g/dL (ref 1.5–4.5)
Glucose: 89 mg/dL (ref 70–99)
Potassium: 4.5 mmol/L (ref 3.5–5.2)
Sodium: 141 mmol/L (ref 134–144)
Total Protein: 7.4 g/dL (ref 6.0–8.5)
eGFR: 95 mL/min/{1.73_m2} (ref >59)

## 2022-08-04 LAB — CBC WITH DIFFERENTIAL/PLATELET
Basophils Absolute: 0 10*3/uL (ref 0.0–0.2)
Basos: 1 %
EOS (ABSOLUTE): 0 10*3/uL (ref 0.0–0.4)
Eos: 0 %
Hematocrit: 44.6 % (ref 37.5–51.0)
Hemoglobin: 14.8 g/dL (ref 13.0–17.7)
Immature Grans (Abs): 0 10*3/uL (ref 0.0–0.1)
Immature Granulocytes: 0 %
Lymphocytes Absolute: 1.3 10*3/uL (ref 0.7–3.1)
Lymphs: 42 %
MCH: 30.5 pg (ref 26.6–33.0)
MCHC: 33.2 g/dL (ref 31.5–35.7)
MCV: 92 fL (ref 79–97)
Monocytes Absolute: 0.3 10*3/uL (ref 0.1–0.9)
Monocytes: 10 %
Neutrophils Absolute: 1.5 10*3/uL (ref 1.4–7.0)
Neutrophils: 47 %
Platelets: 204 10*3/uL (ref 150–450)
RBC: 4.85 x10E6/uL (ref 4.14–5.80)
RDW: 13.5 % (ref 11.6–15.4)
WBC: 3.2 10*3/uL — ABNORMAL LOW (ref 3.4–10.8)

## 2022-08-04 LAB — LIPID PANEL
Chol/HDL Ratio: 3.1 ratio (ref 0.0–5.0)
Cholesterol, Total: 174 mg/dL (ref 100–199)
HDL: 56 mg/dL (ref >39)
LDL Chol Calc (NIH): 104 mg/dL — ABNORMAL HIGH (ref 0–99)
Triglycerides: 74 mg/dL (ref 0–149)
VLDL Cholesterol Cal: 14 mg/dL (ref 5–40)

## 2022-08-04 LAB — HEMOGLOBIN A1C
Est. average glucose Bld gHb Est-mCnc: 120 mg/dL
Hgb A1c MFr Bld: 5.8 % — ABNORMAL HIGH (ref 4.8–5.6)

## 2022-08-04 LAB — PROLACTIN: Prolactin: 6.1 ng/mL (ref 3.6–31.5)

## 2022-08-04 LAB — HIV ANTIBODY (ROUTINE TESTING W REFLEX): HIV Screen 4th Generation wRfx: NONREACTIVE

## 2022-08-04 LAB — HEPATITIS C ANTIBODY: Hep C Virus Ab: NONREACTIVE

## 2022-09-02 ENCOUNTER — Ambulatory Visit (INDEPENDENT_AMBULATORY_CARE_PROVIDER_SITE_OTHER): Payer: Self-pay

## 2022-09-02 ENCOUNTER — Ambulatory Visit: Payer: Self-pay | Admitting: Family Medicine

## 2022-09-02 ENCOUNTER — Encounter: Payer: Self-pay | Admitting: Family Medicine

## 2022-09-02 VITALS — BP 112/74 | HR 83 | Temp 98.7°F | Resp 20 | Ht 73.0 in | Wt 234.6 lb

## 2022-09-02 DIAGNOSIS — R042 Hemoptysis: Secondary | ICD-10-CM

## 2022-09-02 DIAGNOSIS — R051 Acute cough: Secondary | ICD-10-CM

## 2022-09-02 MED ORDER — AZITHROMYCIN 250 MG PO TABS: ORAL_TABLET | ORAL | 0 refills | Status: AC

## 2022-09-02 NOTE — Progress Notes (Signed)
Acute Office Visit  Subjective:     Patient ID: Justin Jacobson, male    DOB: [birth year], 27 y.o.   MRN: [id number]  Chief Complaint  Patient presents with   Cough    Patient states that last week he was sick he states he may have had a fever because he was hot and cold, he also states that he experienced some body aches as well. Patient states that when he coughs now he coughs up mucus with some blood    HPI Patient is in today for acute visit.  Pt reports he was sick 2 weeks ago.  He reports just returning from a trip from [place] and his symptoms started a few days after he landed back in [place]. He had chills with fever along with aches in his legs. He also had sore throat with cough/sneezing. He was taking Nyquil but didn't help with the cough. He says his mucus was blood tinged. He says he feels better than last week. He still has a cough with rhinorrhea. He also reports continued blood tinged phlegm. No wheezing, chest pains or SOB. Reports his cough is deep. No more fevers. No other sick contacts.   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  HENT:  Negative for congestion, sinus pain and sore throat.   Respiratory:  Positive for cough and hemoptysis. Negative for sputum production, shortness of breath and wheezing.   Cardiovascular:  Negative for chest pain and palpitations.  All other systems reviewed and are negative.      Objective:    BP 112/74   Pulse 83   Temp 98.7 F (37.1 C) (Oral)   Resp 20   Ht 6\' 1"  (1.854 m)   Wt 234 lb 9.6 oz (106.4 kg)   SpO2 97%   BMI 30.95 kg/m    Physical Exam Vitals and nursing note reviewed.  Constitutional:      Appearance: Normal appearance. He is normal weight.  HENT:     Head: Normocephalic and atraumatic.     Right Ear: Tympanic membrane, ear canal and external ear  normal.     Left Ear: Tympanic membrane, ear canal and external ear normal.     Nose: Nose normal.     Mouth/Throat:     Mouth: Mucous membranes are moist.     Pharynx: Oropharynx is clear.  Eyes:     Extraocular Movements: Extraocular movements intact.     Conjunctiva/sclera: Conjunctivae normal.     Pupils: Pupils are equal, round, and reactive to light.  Cardiovascular:     Rate and Rhythm: Normal rate and regular rhythm.     Pulses: Normal pulses.     Heart sounds: Normal heart sounds.  Pulmonary:     Effort: Pulmonary effort is normal. No respiratory distress.     Breath sounds: Normal breath sounds. No wheezing or rales.  Abdominal:     General: Abdomen is flat.  Skin:    General: Skin is warm.     Capillary Refill: Capillary refill takes less than 2 seconds.  Neurological:     General: No focal deficit present.     Mental Status: He is alert and oriented to person, place, and time. Mental status is at baseline.  Psychiatric:        Mood and Affect: Mood normal.  Behavior: Behavior normal.        Thought Content: Thought content normal.        Judgment: Judgment normal.   No results found for any visits on 09/02/22.      Assessment & Plan:   Problem List Items Addressed This Visit   None  Acute cough -     Azithromycin; Take 2 tablets on day 1, then 1 tablet daily on days 2 through 5  Dispense: 6 tablet; Refill: 0 -     DG Chest 2 View; Future  Blood-tinged sputum -     Azithromycin; Take 2 tablets on day 1, then 1 tablet daily on days 2 through 5  Dispense: 6 tablet; Refill: 0 -     DG Chest 2 View; Future   Due to continued cough with blood tinged sputum,. Will send for xray and rule out Pneumonia. Go ahead and empirically treat with azithromycin to cover community acquired pneumonia pending results.   No orders of the defined types were placed in this encounter.   No follow-ups on file.  Suzan Slick, MD

## 2023-02-12 IMAGING — US US SCROTUM W/ DOPPLER COMPLETE
1 series · 15 of 25 positions shown · non-contrast
Comparison: None.

CLINICAL DATA: Bilateral testicle pain for 5 days.

EXAM:
SCROTAL ULTRASOUND
DOPPLER ULTRASOUND OF THE TESTICLES
TECHNIQUE: Complete ultrasound examination of the testicles, epididymis, and
other scrotal structures was performed. Color and spectral Doppler
ultrasound were also utilized to evaluate blood flow to the
testicles.

[Series 1: us art/ven flow abd pelv doppl mc & wl · 15 of 68 slices shown]
[im 1/68]
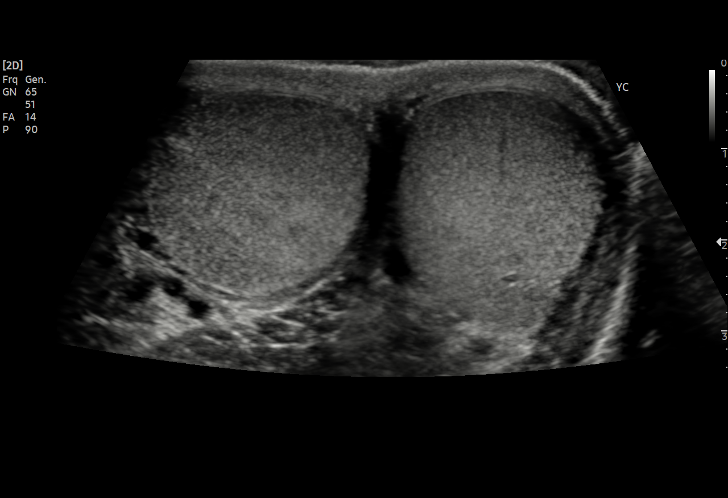
[im 6/68]
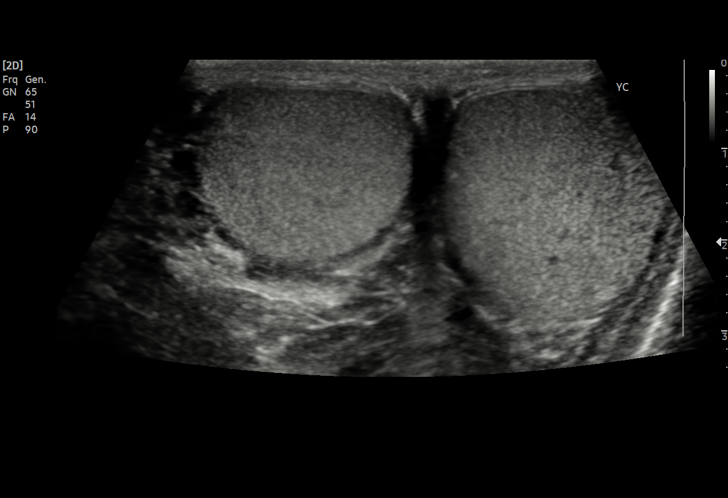
[im 12/68]
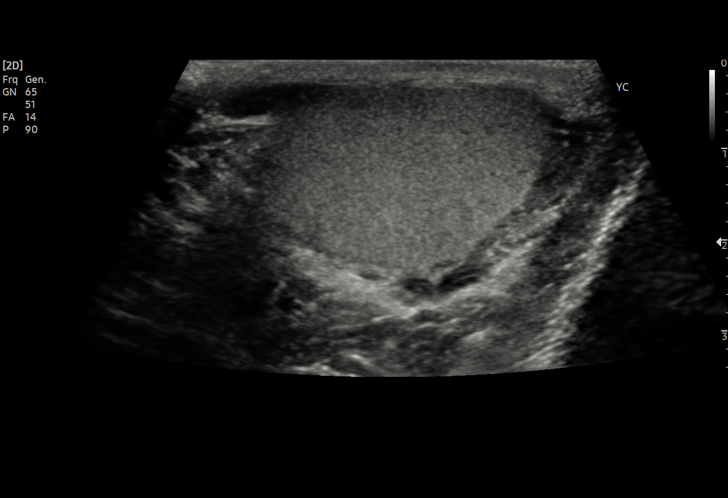
[im 14/68]
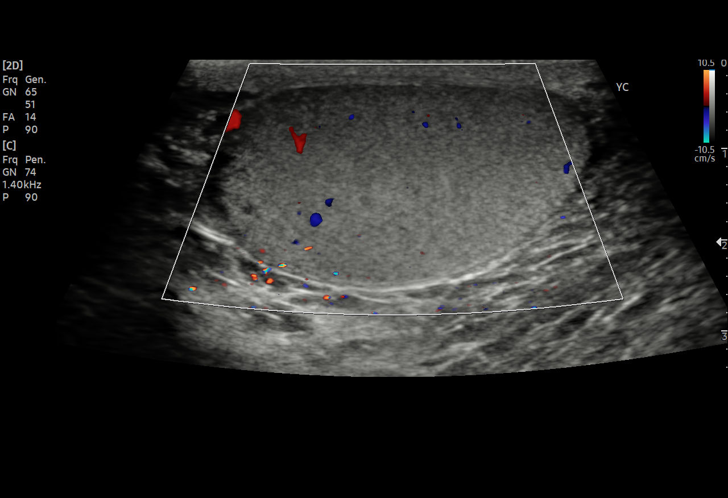
[im 20/68]
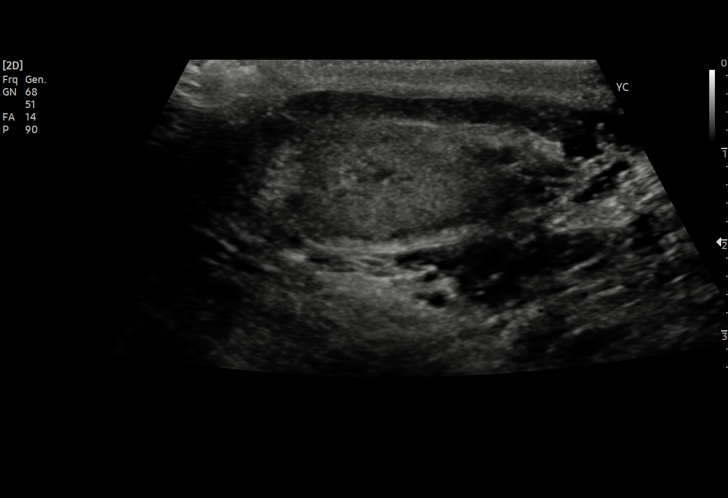
[im 26/68]
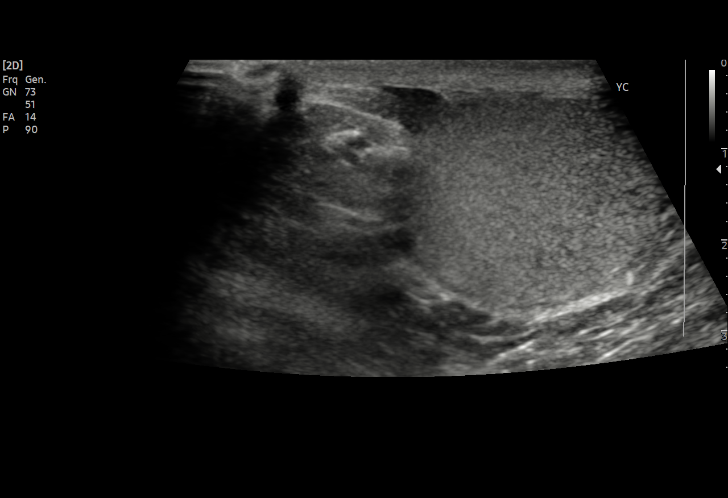
[im 28/68]
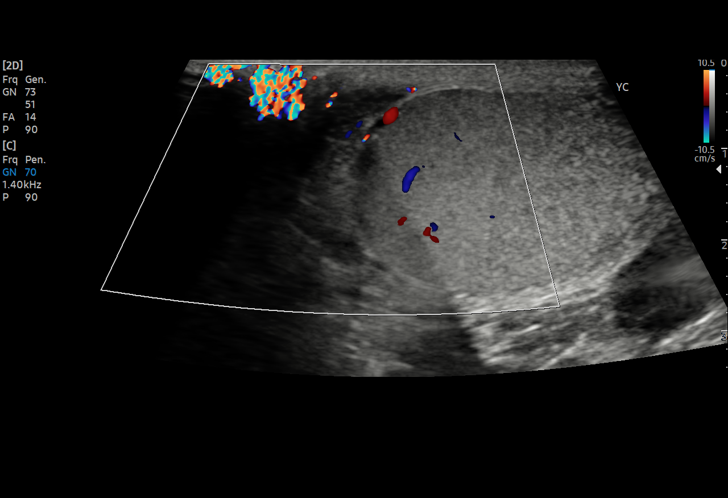
[im 34/68]
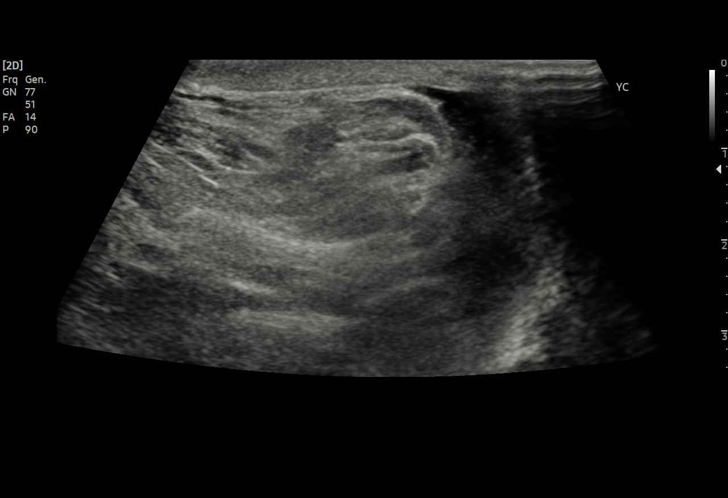
[im 40/68]
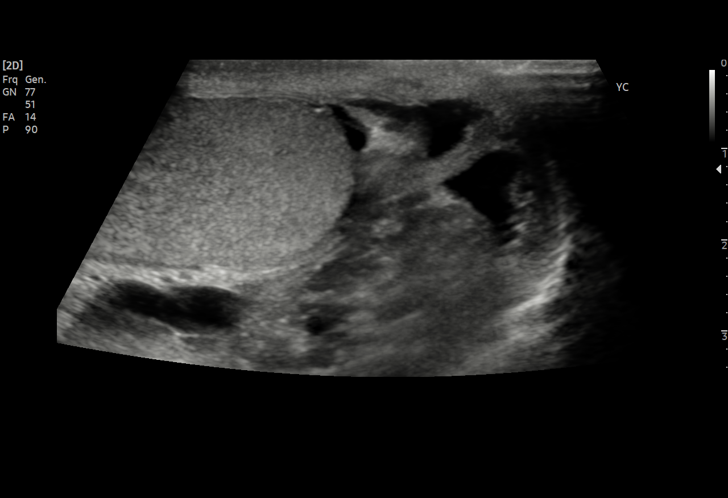
[im 42/68]
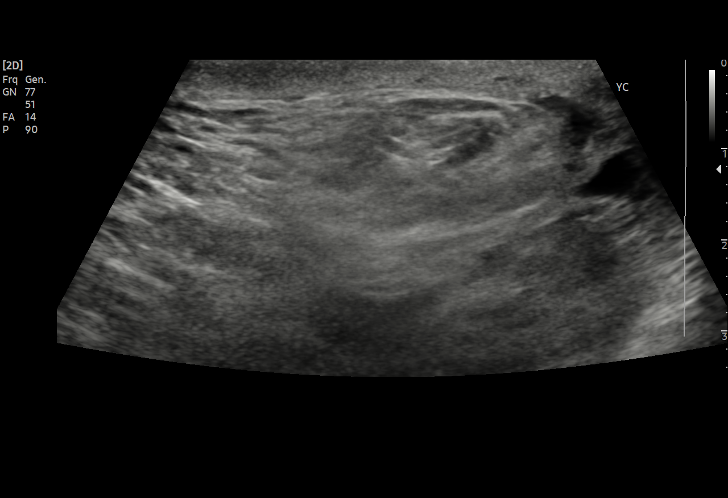
[im 48/68]
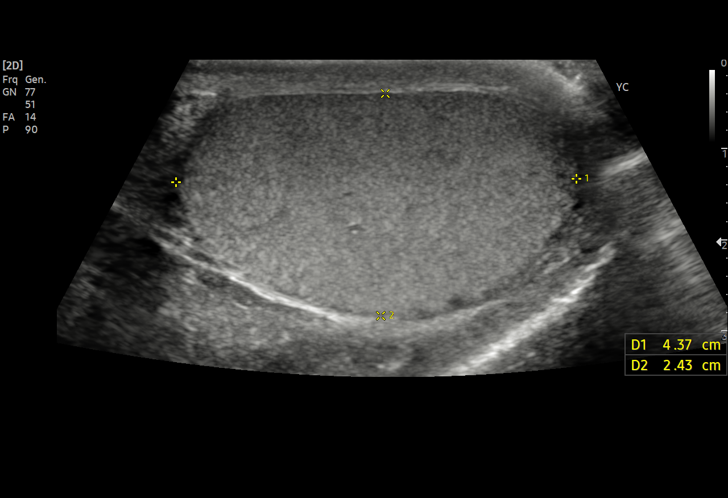
[im 54/68]
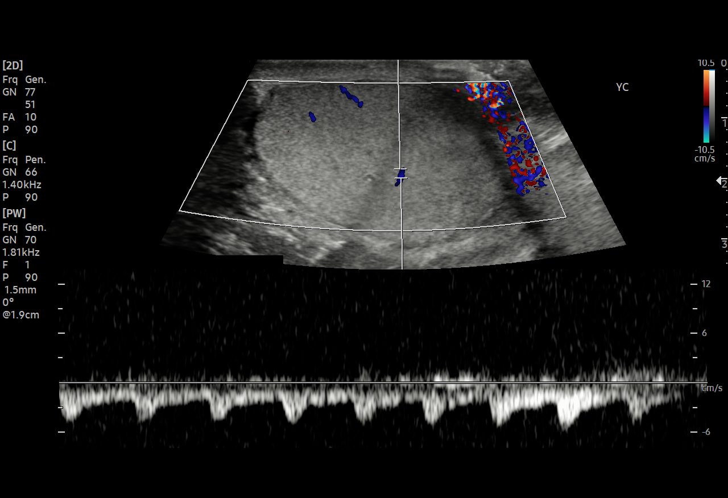
[im 56/68]
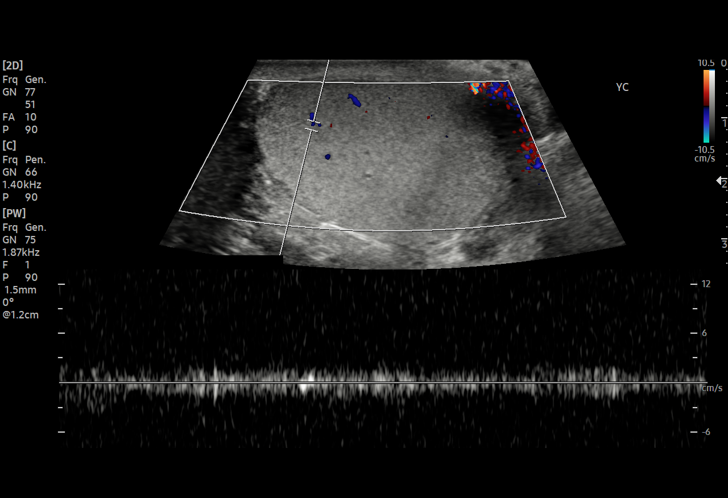
[im 62/68]
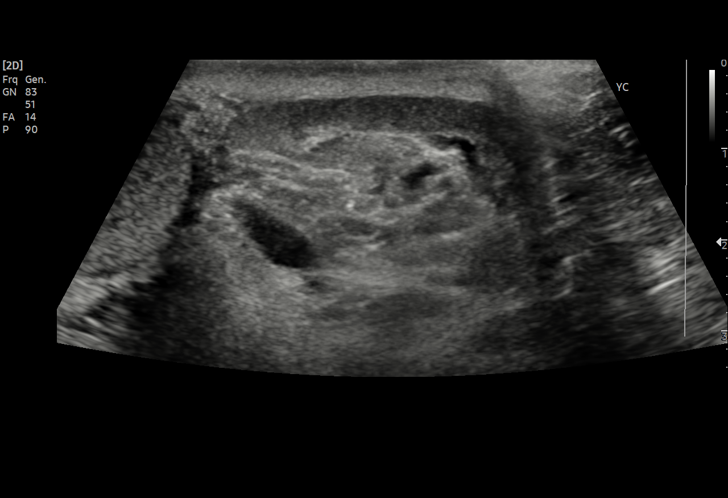
[im 68/68]
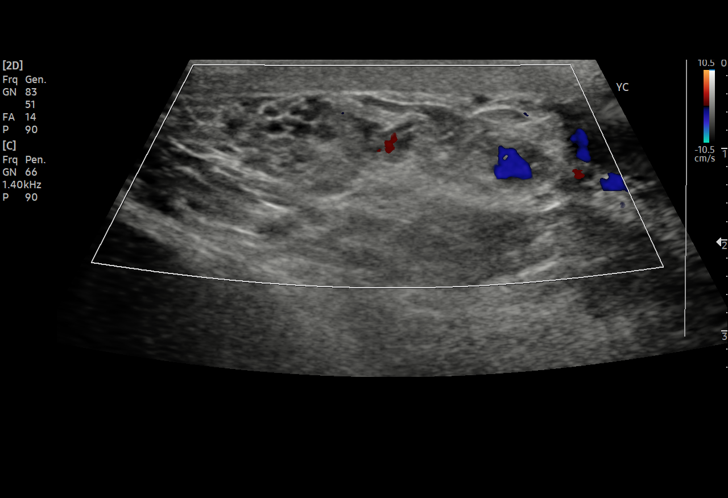

[15 of 25 positions shown; findings below may reference images not displayed]

FINDINGS: Right testicle

Measurements: 4.4 x 2.3 x 2.9 cm. No mass or microlithiasis
visualized.

Left testicle

Measurements: 4.4 x 2.4 x 2.7 cm. No mass or microlithiasis
visualized.

Right epididymis:  Normal in size and appearance.

Left epididymis:  Normal in size and appearance.

Hydrocele:  None visualized.

Varicocele:  None visualized.

Pulsed Doppler interrogation of both testes demonstrates normal low
resistance arterial and venous waveforms bilaterally.

There is a questionable right inguinal hernia, not well demonstrated
on this exam.
IMPRESSION: 1. No evidence of testicular torsion or other acute process.
2. Questionable right inguinal hernia.

## 2024-01-20 ENCOUNTER — Ambulatory Visit: Payer: Self-pay | Admitting: Family Medicine

## 2024-02-10 ENCOUNTER — Ambulatory Visit: Payer: Self-pay | Admitting: Family Medicine

## 2024-06-02 ENCOUNTER — Encounter: Payer: Self-pay | Admitting: Family Medicine

## 2024-06-02 ENCOUNTER — Ambulatory Visit: Payer: Self-pay | Admitting: Family Medicine

## 2024-06-02 VITALS — BP 106/72 | HR 72 | Ht 73.0 in | Wt 250.0 lb

## 2024-06-02 DIAGNOSIS — Z0000 Encounter for general adult medical examination without abnormal findings: Secondary | ICD-10-CM

## 2024-06-02 DIAGNOSIS — Z136 Encounter for screening for cardiovascular disorders: Secondary | ICD-10-CM

## 2024-06-02 DIAGNOSIS — R7302 Impaired glucose tolerance (oral): Secondary | ICD-10-CM

## 2024-06-02 DIAGNOSIS — Z23 Encounter for immunization: Secondary | ICD-10-CM

## 2024-06-02 DIAGNOSIS — Z13220 Encounter for screening for lipoid disorders: Secondary | ICD-10-CM

## 2024-06-02 NOTE — Progress Notes (Signed)
 "  Complete physical exam  Patient: Justin Jacobson   DOB: 1996-04-17   28 y.o. Male  MRN: [id number]  Subjective:    Chief Complaint  Patient presents with   Annual Exam    No concerns - okay with lab work - fasting    Justin Jacobson is a 29 y.o. male who presents today for a complete physical exam. He reports consuming a general diet. The patient does not participate in regular exercise at present. He generally feels well. He reports sleeping well. He does not have additional problems to discuss today.    Most recent fall risk assessment:    06/02/2024    9:57 AM  Fall Risk   Falls in the past year? 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 No pertinent past medical history. History reviewed. No pertinent surgical history. Social History   Socioeconomic History   Marital status: Single    Spouse name: Not on file   Number of children: Not on file   Years of education: Not on file   Highest education level: Not on file  Occupational History   Not on file  Tobacco Use   Smoking status: Never   Smokeless tobacco: Not on file  Substance and Sexual Activity   Alcohol use: No   Drug use: No   Sexual activity: Not on file  Other Topics Concern   Not on file  Social History Narrative   Not on file   Social Drivers of Health   Tobacco Use: Unknown (06/02/2024)   Patient History    Smoking Tobacco Use: Never    Smokeless Tobacco Use: Unknown    Passive Exposure: Not on file  Financial Resource Strain: Not on file  Food Insecurity: Not on file  Transportation Needs: Not on file  Physical Activity: Not on file  Stress: Not on file   Social Connections: Not on file  Intimate Partner Violence: Not on file  Depression (PHQ2-9): Low Risk (06/02/2024)   Depression (PHQ2-9)    PHQ-2 Score: 3  Alcohol Screen: Not on file  Housing: Not on file  Utilities: Not on file  Health Literacy: Not on file   Family History  Problem Relation Age of Onset   Heart failure Mother    Cancer Father    Allergies[1]    Patient Care Team: Colette Torrence GRADE, MD as PCP - General (Family Medicine)   Show/hide medication list[2]  Review of Systems  All other systems reviewed and are negative.         Objective:     BP 106/72 (BP Location: Left Arm, Patient Position: Sitting, Cuff Size: Normal)   Pulse 72   Ht 6' 1 (1.854 m)   Wt 250 lb (113.4 kg)   SpO2 99%   BMI 32.98 kg/m  BP Readings from Last 3 Encounters:  06/02/24 106/72  09/02/22 112/74  08/03/22 128/84      Physical Exam Vitals and nursing note reviewed.  Constitutional:      Appearance: Normal appearance. He is normal  weight.  HENT:     Head: Normocephalic and atraumatic.     Right Ear: Tympanic membrane, ear canal and external ear normal.     Left Ear: Tympanic membrane, ear canal and external ear normal.     Nose: Nose normal.     Mouth/Throat:     Mouth: Mucous membranes are moist.     Pharynx: Oropharynx is clear.  Eyes:     Conjunctiva/sclera: Conjunctivae normal.     Pupils: Pupils are equal, round, and reactive to light.  Cardiovascular:     Rate and Rhythm: Normal rate and regular rhythm.     Pulses: Normal pulses.     Heart sounds: Normal heart sounds.  Pulmonary:     Effort: Pulmonary effort is normal.     Breath sounds: Normal breath sounds.  Abdominal:     General: Abdomen is flat. Bowel sounds are normal.  Skin:    General: Skin is warm.     Capillary Refill: Capillary refill takes less than 2 seconds.  Neurological:     General: No focal deficit present.     Mental Status: He is alert and oriented to person, place, and time.  Mental status is at baseline.  Psychiatric:        Mood and Affect: Mood normal.        Behavior: Behavior normal.        Thought Content: Thought content normal.        Judgment: Judgment normal.      No results found for any visits on 06/02/24.     Assessment & Plan:    Routine Health Maintenance and Physical Exam  Immunization History  Administered Date(s) Administered   Tdap 06/15/2012    Health Maintenance  Topic Date Due   Hepatitis B Vaccines 19-59 Average Risk (1 of 3 - 19+ 3-dose series) Never done   HPV VACCINES (1 - 3-dose SCDM series) Never done   DTaP/Tdap/Td (2 - Td or Tdap) 06/15/2022   COVID-19 Vaccine (1 - 2025-26 season) 06/18/2024 (Originally 01/24/2024)   Influenza Vaccine  08/22/2024 (Originally 12/24/2023)   Hepatitis C Screening  Completed   HIV Screening  Completed   Pneumococcal Vaccine  Aged Out   Meningococcal B Vaccine  Aged Out    Discussed health benefits of physical activity, and encouraged him to engage in regular exercise appropriate for his age and condition.  Problem List Items Addressed This Visit   None Visit Diagnoses       Annual physical exam    -  Primary   Relevant Orders   Lipid panel   HgB A1c   Comprehensive Metabolic Panel (CMET)   CBC with Differential/Platelet     Need for Tdap vaccination       Relevant Orders   Tdap vaccine greater than or equal to 7yo IM     Encounter for lipid screening for cardiovascular disease       Relevant Orders   Lipid panel     Impaired glucose tolerance       Relevant Orders   HgB A1c   Comprehensive Metabolic Panel (CMET)   CBC with Differential/Platelet      No follow-ups on file. Annual physical exam -     Lipid panel -     Hemoglobin A1c -     Comprehensive metabolic panel with GFR -     CBC with Differential/Platelet  Need for Tdap vaccination -     Tdap vaccine greater than or  equal to 7yo IM  Encounter for lipid screening for cardiovascular disease -     Lipid  panel  Impaired glucose tolerance -     Hemoglobin A1c -     Comprehensive metabolic panel with GFR -     CBC with Differential/Platelet  Screening labs Tdap today.     Torrence CINDERELLA Barrier, MD      [1] No Known Allergies [2]  No outpatient medications prior to visit.   No facility-administered medications prior to visit.   "

## 2024-06-03 LAB — COMPREHENSIVE METABOLIC PANEL WITH GFR
ALT: 24 IU/L (ref 0–44)
AST: 27 IU/L (ref 0–40)
Albumin: 4.6 g/dL (ref 4.3–5.2)
Alkaline Phosphatase: 44 IU/L — ABNORMAL LOW (ref 47–123)
BUN/Creatinine Ratio: 8 — ABNORMAL LOW (ref 9–20)
BUN: 10 mg/dL (ref 6–20)
Bilirubin Total: 0.4 mg/dL (ref 0.0–1.2)
CO2: 25 mmol/L (ref 20–29)
Calcium: 9.4 mg/dL (ref 8.7–10.2)
Chloride: 101 mmol/L (ref 96–106)
Creatinine, Ser: 1.18 mg/dL (ref 0.76–1.27)
Globulin, Total: 2.9 g/dL (ref 1.5–4.5)
Glucose: 82 mg/dL (ref 70–99)
Potassium: 4.5 mmol/L (ref 3.5–5.2)
Sodium: 139 mmol/L (ref 134–144)
Total Protein: 7.5 g/dL (ref 6.0–8.5)
eGFR: 85 mL/min/1.73

## 2024-06-03 LAB — CBC WITH DIFFERENTIAL/PLATELET
Basophils Absolute: 0 x10E3/uL (ref 0.0–0.2)
Basos: 1 %
EOS (ABSOLUTE): 0 x10E3/uL (ref 0.0–0.4)
Eos: 1 %
Hematocrit: 46 % (ref 37.5–51.0)
Hemoglobin: 14.8 g/dL (ref 13.0–17.7)
Immature Grans (Abs): 0 x10E3/uL (ref 0.0–0.1)
Immature Granulocytes: 0 %
Lymphocytes Absolute: 2 x10E3/uL (ref 0.7–3.1)
Lymphs: 51 %
MCH: 30.6 pg (ref 26.6–33.0)
MCHC: 32.2 g/dL (ref 31.5–35.7)
MCV: 95 fL (ref 79–97)
Monocytes Absolute: 0.3 x10E3/uL (ref 0.1–0.9)
Monocytes: 9 %
Neutrophils Absolute: 1.4 x10E3/uL (ref 1.4–7.0)
Neutrophils: 38 %
Platelets: 203 x10E3/uL (ref 150–450)
RBC: 4.83 x10E6/uL (ref 4.14–5.80)
RDW: 13.7 % (ref 11.6–15.4)
WBC: 3.8 x10E3/uL (ref 3.4–10.8)

## 2024-06-03 LAB — LIPID PANEL
Chol/HDL Ratio: 3.8 ratio (ref 0.0–5.0)
Cholesterol, Total: 174 mg/dL (ref 100–199)
HDL: 46 mg/dL
LDL Chol Calc (NIH): 111 mg/dL — ABNORMAL HIGH (ref 0–99)
Triglycerides: 91 mg/dL (ref 0–149)
VLDL Cholesterol Cal: 17 mg/dL (ref 5–40)

## 2024-06-03 LAB — HEMOGLOBIN A1C
Est. average glucose Bld gHb Est-mCnc: 117 mg/dL
Hgb A1c MFr Bld: 5.7 % — ABNORMAL HIGH (ref 4.8–5.6)

## 2024-06-06 ENCOUNTER — Ambulatory Visit: Payer: Self-pay | Admitting: Family Medicine
